# Patient Record
Sex: Male | Born: 2000 | Race: White | Hispanic: No | Marital: Single | Smoking: Never smoker
Health system: Southern US, Community
[De-identification: ages and names within clinical notes are randomized; demographics above are authoritative.]

---
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
## 2018-10-25 ENCOUNTER — Other Ambulatory Visit: Payer: Self-pay

## 2018-10-25 ENCOUNTER — Encounter (HOSPITAL_COMMUNITY): Payer: Self-pay

## 2018-10-25 ENCOUNTER — Ambulatory Visit (HOSPITAL_COMMUNITY)
Admission: EM | Admit: 2018-10-25 | Discharge: 2018-10-25 | Disposition: A | Discharge status: Home / Self Care | Payer: BLUE CROSS/BLUE SHIELD | Attending: Family Medicine | Admitting: Family Medicine

## 2018-10-25 DIAGNOSIS — R0602 Shortness of breath: Secondary | ICD-10-CM | POA: Diagnosis not present

## 2018-10-25 MED ORDER — CETIRIZINE HCL 10 MG PO TABS
10.0000 mg | ORAL_TABLET | Freq: Every day | ORAL | 0 refills | Status: AC
Start: 2018-10-25 — End: ?

## 2018-10-25 MED ORDER — ALBUTEROL SULFATE HFA 108 (90 BASE) MCG/ACT IN AERS: 1.0000 | INHALATION_SPRAY | Freq: Four times a day (QID) | RESPIRATORY_TRACT | 0 refills | Status: AC | PRN

## 2018-10-25 NOTE — ED Triage Notes (Signed)
Pt presents with complaints of shortness of breath. Reports being outside a lot for work concerned for allergies. Denies any fever, cough.

## 2018-10-25 NOTE — Discharge Instructions (Signed)
This sounds likely related to allergies, however, unfortunately Covid-19 can cause mild symptoms. With sudden onset for you I do think it's safest for you to self-isolate for 7 days from onset.  Please start daily allergy medication.  Use of inhaler as needed for wheezing or shortness of breath.   If develop worsening of symptoms, difficulty breathing or shortness of breath , chest pain  or otherwise worsening please go to the ER.

## 2018-10-25 NOTE — ED Provider Notes (Signed)
[facility]    CSN: [id number] Arrival date & time: 10/25/18  1051     History   Chief Complaint Chief Complaint  Patient presents with  . Shortness of Breath    HPI Jaci Carrelustin K Dillavou is a 18 y.o. male.   Jaci CarrelAustin K Birdsell presents with complaints of shortness of breath . 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 No chest pain . No cough. Mild runny nose. No sore throat. Symptoms started two days ago. Feels some wheezing when episodes occur. No previous similar. Hasn't taken any medications. No current shortness of breath . Primarily with activity. No pain associated. No fevers. No asthma history. No known ill contacts. No travel. No leg pain or swelling. Without contributing medical history.      ROS per HPI, negative if not otherwise mentioned.      History reviewed. No pertinent past medical history.  There are no active problems to display for this patient.   History reviewed. No pertinent surgical history.     Home Medications    Prior to Admission medications   Medication Sig Start Date End Date Taking? Authorizing Provider  albuterol (PROAIR HFA) 108 (90 Base) MCG/ACT inhaler Inhale 1-2 puffs into the lungs every 6 (six) hours as needed for wheezing or shortness of breath. 10/25/18   Georgetta HaberBurky, Laterria Lasota B, NP  cetirizine (ZYRTEC) 10 MG tablet Take 1 tablet (10 mg total) by mouth daily. 10/25/18   Georgetta HaberBurky, Elon Lomeli B, NP  naproxen (NAPROSYN) 500 MG tablet Take 1 tablet (500 mg total) by mouth 2 (two) times daily. 08/20/17   Sharlene DoryWendling, Nicholas Paul, DO    Family History Family History  Problem Relation Age of Onset  . Healthy Mother   . Healthy Father     Social History Social History   Tobacco Use  . Smoking status: Never Smoker  . Smokeless tobacco: Never Used  Substance Use Topics  . Alcohol use: No    Frequency: Never  . Drug use: No      Allergies   Patient has no known allergies.   Review of Systems Review of Systems   Physical Exam Triage Vital Signs ED Triage Vitals [10/25/18 1123]  Enc Vitals Group     BP (!) 150/83     Pulse Rate 73     Resp 20     Temp 98.7 F (37.1 C)     Temp src      SpO2 99 %     Weight      Height      Head Circumference      Peak Flow      Pain Score 0     Pain Loc      Pain Edu?      Excl. in GC?    No data found.  Updated Vital Signs BP (!) 150/83   Pulse 73   Temp 98.7 F (37.1 C)   Resp 20   SpO2 99%    Physical Exam Constitutional:      Appearance: He is well-developed.  Cardiovascular:     Rate and Rhythm: Normal rate.  Pulmonary:     Effort: Pulmonary effort is normal.  Skin:    General: Skin is warm and dry.  Neurological:     Mental Status: He is alert and oriented to person, place, and time.  UC Treatments / Results  Labs (all labs ordered are listed, but only abnormal results are displayed) Labs Reviewed - No data to display  EKG None  Radiology No results found.  Procedures Procedures (including critical care time)  Medications Ordered in UC Medications - No data to display  Initial Impression / Assessment and Plan / UC Course  I have reviewed the triage vital signs and the nursing notes.  Pertinent labs & imaging results that were available during my care of the patient were reviewed by me and considered in my medical decision making (see chart for details).     New onset of shortness of breath . Non toxic. Afebrile. No increased work of breathing. During time of covid-19 pandemic with new onset of shortness of breath  I do recommend self-isolation for 7 days. However, it does sound like outdoor triggers are source of symptoms. Allergy medications recommended. Return precautions provided. Patient verbalized understanding and agreeable to plan.    Final Clinical Impressions(s) / UC Diagnoses   Final diagnoses:  SOB  (shortness of breath)     Discharge Instructions     This sounds likely related to allergies, however, unfortunately Covid-19 can cause mild symptoms. With sudden onset for you I do think it's safest for you to self-isolate for 7 days from onset.  Please start daily allergy medication.  Use of inhaler as needed for wheezing or shortness of breath.   If develop worsening of symptoms, difficulty breathing or shortness of breath , chest pain  or otherwise worsening please go to the ER.     ED Prescriptions    Medication Sig Dispense Auth. Provider   cetirizine (ZYRTEC) 10 MG tablet Take 1 tablet (10 mg total) by mouth daily. 30 tablet Linus Mako B, NP   albuterol (PROAIR HFA) 108 (90 Base) MCG/ACT inhaler Inhale 1-2 puffs into the lungs every 6 (six) hours as needed for wheezing or shortness of breath. 1 Inhaler Georgetta Haber, NP     Controlled Substance Prescriptions [place] Controlled Substance Registry consulted? Not Applicable   Georgetta Haber, NP 10/25/18 1308

## 2019-04-26 IMAGING — DX DG ANKLE COMPLETE 3+V*R*
3 series · 3 of 3 positions shown · non-contrast
Comparison: None.

CLINICAL DATA: Fall

EXAM:
RIGHT ANKLE - COMPLETE 3+ VIEW

[ankle ap]
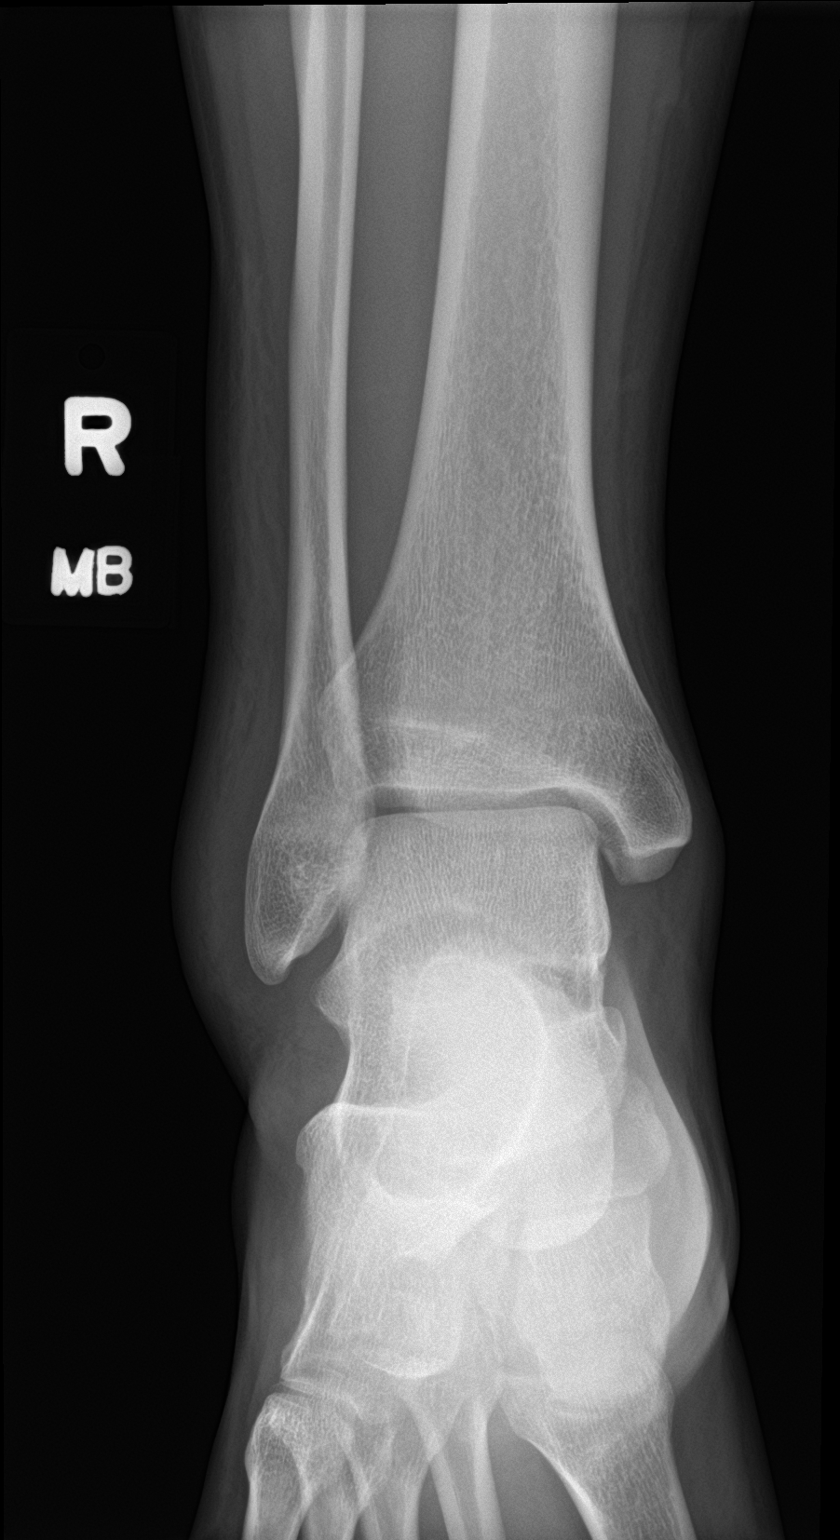

[ankle obl]
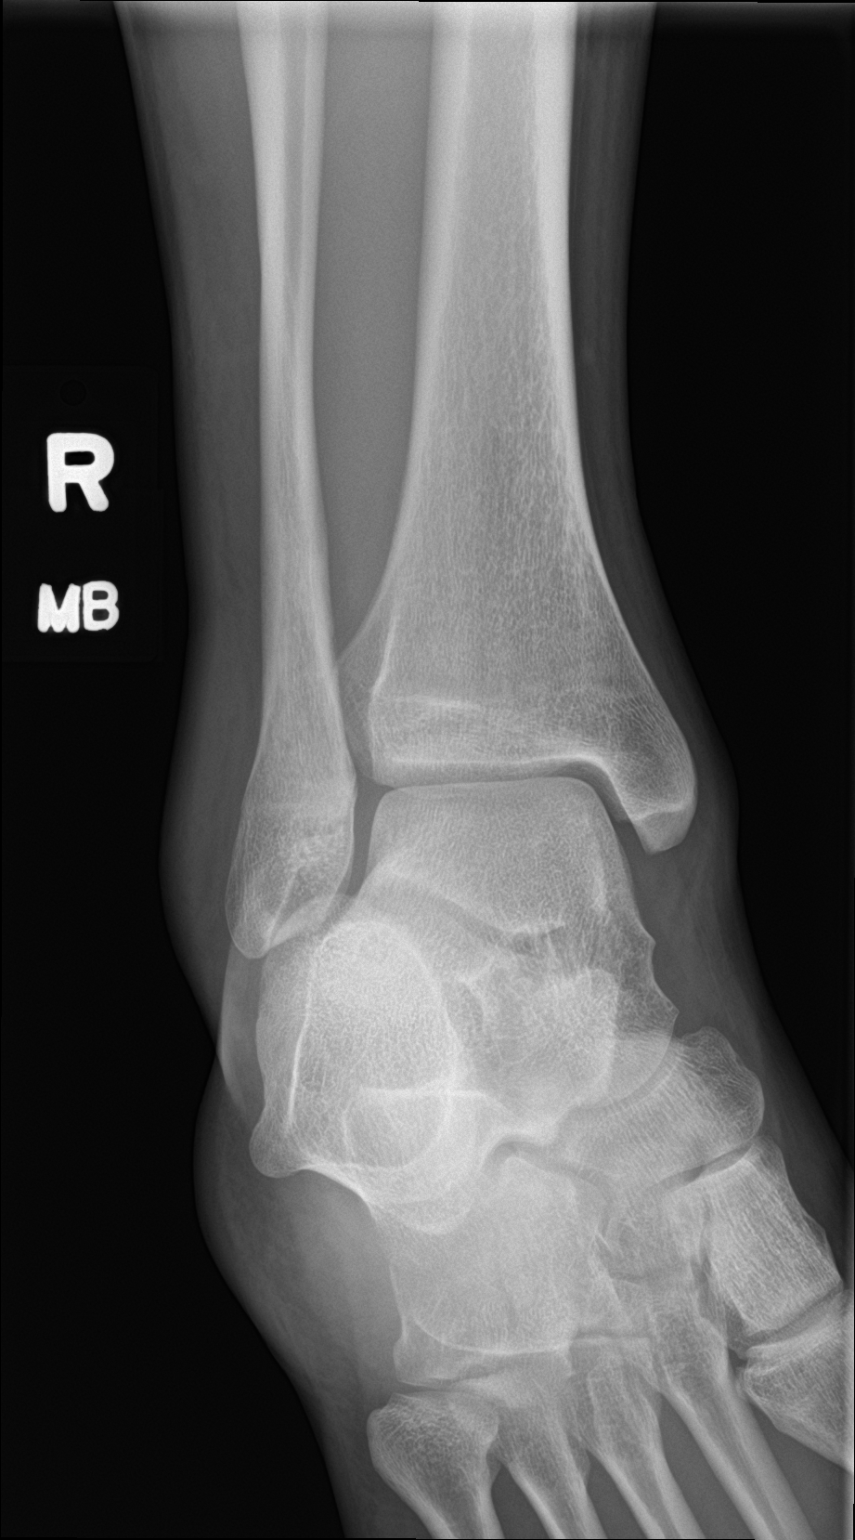

[ankle lat]
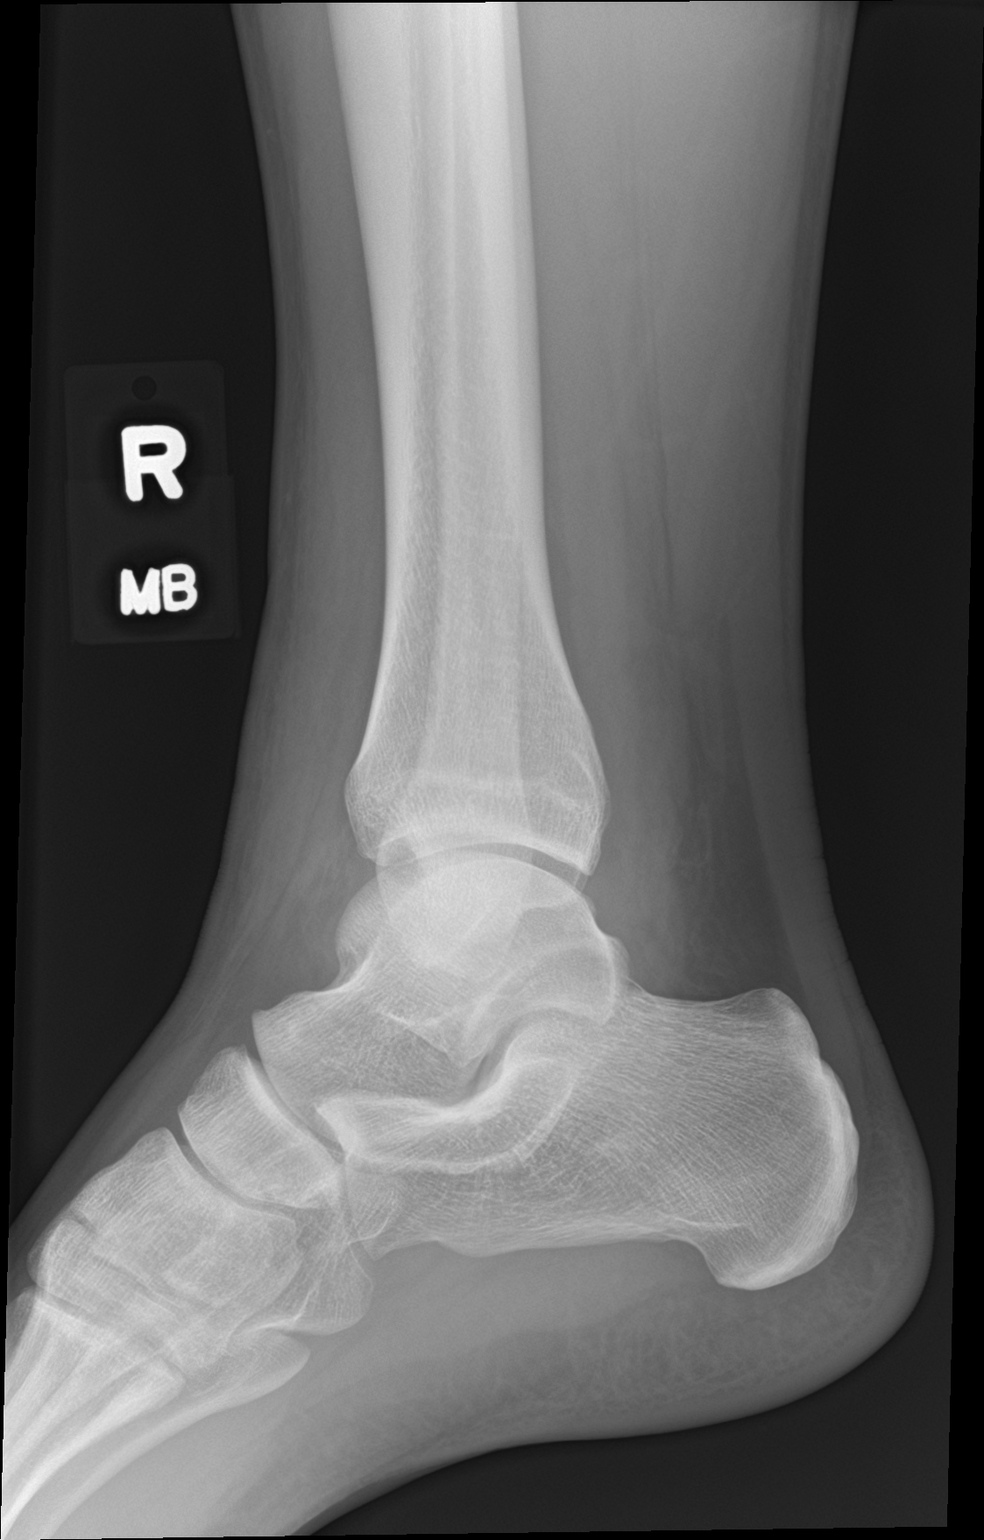

[3 of 3 positions shown; findings below may reference images not displayed]

FINDINGS: Lateral soft tissue swelling. Joint spaces are maintained. No
fracture, subluxation or dislocation.
IMPRESSION: Lateral soft tissue swelling.  No acute bony abnormality.

## 2022-05-14 DIAGNOSIS — Z419 Encounter for procedure for purposes other than remedying health state, unspecified: Secondary | ICD-10-CM | POA: Diagnosis not present

## 2022-06-14 DIAGNOSIS — Z419 Encounter for procedure for purposes other than remedying health state, unspecified: Secondary | ICD-10-CM | POA: Diagnosis not present

## 2022-07-15 DIAGNOSIS — Z419 Encounter for procedure for purposes other than remedying health state, unspecified: Secondary | ICD-10-CM | POA: Diagnosis not present

## 2022-08-13 DIAGNOSIS — Z419 Encounter for procedure for purposes other than remedying health state, unspecified: Secondary | ICD-10-CM | POA: Diagnosis not present

## 2022-09-13 DIAGNOSIS — Z419 Encounter for procedure for purposes other than remedying health state, unspecified: Secondary | ICD-10-CM | POA: Diagnosis not present

## 2022-10-13 DIAGNOSIS — Z419 Encounter for procedure for purposes other than remedying health state, unspecified: Secondary | ICD-10-CM | POA: Diagnosis not present

## 2022-11-13 DIAGNOSIS — Z419 Encounter for procedure for purposes other than remedying health state, unspecified: Secondary | ICD-10-CM | POA: Diagnosis not present
